# Patient Record
Sex: Male | Born: 1970 | Hispanic: Yes | Smoking: Former smoker
Health system: Southern US, Community
[De-identification: ages and names within clinical notes are randomized; demographics above are authoritative.]

## PROBLEM LIST (undated history)

## (undated) DIAGNOSIS — E785 Hyperlipidemia, unspecified: Secondary | ICD-10-CM

## (undated) DIAGNOSIS — E119 Type 2 diabetes mellitus without complications: Secondary | ICD-10-CM

## (undated) HISTORY — DX: Hyperlipidemia, unspecified: E78.5

## (undated) HISTORY — DX: Type 2 diabetes mellitus without complications: E11.9

---

## 2000-02-23 ENCOUNTER — Encounter: Payer: Self-pay | Admitting: Occupational Medicine

## 2000-02-23 ENCOUNTER — Encounter: Admission: RE | Admit: 2000-02-23 | Discharge: 2000-02-23 | Payer: Self-pay | Admitting: Occupational Medicine

## 2007-05-17 DIAGNOSIS — I219 Acute myocardial infarction, unspecified: Secondary | ICD-10-CM

## 2007-05-17 HISTORY — DX: Acute myocardial infarction, unspecified: I21.9

## 2008-04-15 HISTORY — PX: CORONARY STENT PLACEMENT: SHX1402

## 2008-04-16 ENCOUNTER — Ambulatory Visit: Payer: Self-pay | Admitting: Critical Care Medicine

## 2008-04-16 ENCOUNTER — Ambulatory Visit: Payer: Self-pay | Admitting: Diagnostic Radiology

## 2008-04-16 ENCOUNTER — Inpatient Hospital Stay (HOSPITAL_COMMUNITY): Admission: AD | Admit: 2008-04-16 | Discharge: 2008-04-21 | Payer: Self-pay | Admitting: Interventional Cardiology

## 2008-04-17 ENCOUNTER — Encounter (INDEPENDENT_AMBULATORY_CARE_PROVIDER_SITE_OTHER): Payer: Self-pay | Admitting: Interventional Cardiology

## 2010-09-28 NOTE — Cardiovascular Report (Signed)
NAMEMARKEITH, Tristan Cunningham              ACCOUNT NO.:  [id number]   MEDICAL RECORD NO.:  [id number]          PATIENT TYPE:  INP   LOCATION:  2904                         FACILITY:  [facility]   PHYSICIAN:  Corky Crafts, MDDATE OF BIRTH:  Feb 11, 1971   DATE OF PROCEDURE:  04/16/2008  DATE OF DISCHARGE:                            CARDIAC CATHETERIZATION   REFERRING PHYSICIAN:  Jake Bathe, MD   PROCEDURES PERFORMED:  Left heart catheterization, coronary angiogram,  PCI to LAD.   OPERATOR:  Corky Crafts, MD   INDICATIONS:  Acute anterior ST elevation MI, VFib arrest.   NARRATIVE OF PROCEDURES:  The patient initially presented to [facility] at about 2 p.m. with chest pain.  His initial ECG was  unremarkable.  At  1407, he had a VF arrest and was shocked for the  first time.  He subsequently had 4 more episodes of ventricular  tachycardia or ventricular fibrillation requiring defibrillation.  He  was stabilized on amiodarone and transferred to the [facility] cath lab  emergently for catheterization.  The patient was intubated.  This was an  emergency procedure and consent could not be obtained because the  patient was sedated.  A 6-French sheath was placed into the right  femoral artery using the modified Seldinger technique.  Right coronary  artery angiography was performed using a JR-4 catheter.  Digital  angiography was performed in multiple projections using hand injection  of contrast.  A CLS 3.5 guiding catheter was then placed at the ostium  of the left main.  The PCI was subsequently performed.  Please see below  for details.  After the PCI, a pigtail catheter was advanced to the  ascending aorta and across the aortic valve under fluoroscopic guidance.  A pullback was performed and continuous hemodynamic pressure monitoring.  The sheath was sutured in place.   FINDINGS:  The RCA was a large dominant vessel.  There is a large PDA,  which is angiographically  normal.  There is a medium-sized  posterolateral artery.  There are mild irregularities in the mid RCA.  The left main coronary artery was widely patent.  The left circumflex  was a large vessel with mild irregularities in the mid circumflex.  The  first obtuse marginal had an ostial 30% stenosis.  The second obtuse  marginal was a large vessel and appeared angiographically normal.  The  OM3 and OM4 were small vessels but widely patent.   The left anterior descending was occluded proximally.   PCI narrative:  A CLS 3.5 guiding catheter was used for the diagnostic  as well as for the intervention.  A Prowater wire was placed across the  lesion in the LAD.  A fetch catheter was advanced to the lesion and  aspiration thrombectomy was performed.  Subsequently, there was  improvement in flow at 2.5 x 15, apex was placed across the lesion and  inflated to 10 atmospheres for 17 seconds and then to 10 atmospheres for  20 seconds.  Wire position was inadvertently lost and a new Prowater  wire was placed down the vessel and  decreased flow was noted.  The apex  balloon was placed again in the vessel and inflated to 12 atmospheres  for 19 seconds and then again to 12 atmospheres for 14 seconds.  There  was a visible dissection in the LAD, but normal flow after this balloon  inflation.  A 3.0 x 24 mm Endeavor stent was then placed across the  entire segment of disease, which was quite long.  This stent was  deployed at 12 atmospheres for 39 seconds.  The proximal portion of the  stent was postdilated with a 3.5 x 15 Voyager  balloon inflated to 16  atmospheres for 28 seconds and then to 16 atmospheres for 26 seconds.  There is an excellent angiographic result with no residual stenosis.   Hemodynamics:  LV pressure of 81/27 with an LVEDP of 38 mmHg.  Aortic  pressure of 91/70 with a mean aortic pressure 79 mmHg.   IMPRESSION:  1. Occluded proximal LAD causing ventricular fibrillation and V-tach       arrest.  2. Significantly elevated LVEDP preventing left ventriculogram from      being performed   RECOMMENDATIONS:  The patient will be watched in the ICU and on the  ventilator overnight.  We will give Lasix given his elevated EDP.  I  would recommend continuing aspirin and Plavix for at least 1 year.  He  will also need aggressive secondary prevention when stabilized.  We did  consider placing an intra-aortic balloon pump, but the patient's blood  pressure was stable at approximately 100 systolic at the end of the  procedure.  His oxygen saturations were 100% on the ventilator.  He had  a mild metabolic acidosis.  Likely with diuresis, we will be able to  manage his respiratory status.  In addition, I did not want to give  additional contrast for an aortogram, given his already elevated EDP.      Corky Crafts, MD  Electronically Signed     JSV/MEDQ  D:  04/16/2008  T:  04/17/2008  Job:  [id number]

## 2010-09-28 NOTE — H&P (Signed)
NAMEHENRY, Tristan Cunningham              ACCOUNT NO.:  [id number]   MEDICAL RECORD NO.:  [id number]          PATIENT TYPE:  INP   LOCATION:  2904                         FACILITY:  [facility]   PHYSICIAN:  Jake Bathe, MD      DATE OF BIRTH:  November 28, 1970   DATE OF ADMISSION:  04/16/2008  DATE OF DISCHARGE:                              HISTORY & PHYSICAL   CHIEF COMPLAINT:  Code STEMI, V tach/v fib arrest, anterior ST-elevation  myocardial infarction.   HISTORY OF PRESENT ILLNESS:  A 40 year old male with no prior cardiac  history who earlier this morning took a 1/2 tablet of Viagra at  approximately 9 a.m.  This was the third time total that he took it.  This afternoon at around 2 o'clock, he developed some left-sided chest  pain and arm pain.  Then, EMS was called.  On arrival, EMS at 4:00 p.m.  noted a blood pressure of 140/110 initially with no significant EKG  changes other than some subtle T-wave inversions in the inferior leads  as well as in retrospect hyperacute T-waves in V2 and V3.  While in  route to [facility] Emergency Department, he arrested.  The first shock  was administered at 16:07.  While in [facility], he was given a bolus of  IV amiodarone, and CPR was intermittently performed.  He remained alert  in between ventricular tachycardiac episodes.  He was shocked a total of  5 times.  Code STEMI was called and Dr. Eldridge Dace was made aware of the  patient.  Once the patient was stabilized and intubated, he was sent  over to [facility] for further treatment.   Cardiac catheterization revealed a proximal LAD occlusion 100%, which  was successfully crossed, and initially [place] catheter  utilized/thrombectomy, then balloon, then drug-eluting stent placement  3.0 x 24 mm stent in the proximal LAD portion.  His blood pressure  following the procedure was in the 80s to 90s systolic and his left  ventricular end-diastolic pressure was 39.  No LV gram was performed  secondary  to high LV pressures.   Since the elevated LVEDP and low blood pressure was noticed, 40 mg of IV  Lasix was administered for preload reduction.   PAST MEDICAL HISTORY:  Erectile dysfunction, presumably otherwise none.   ALLERGIES:  No known drug allergies.   MEDICATIONS:  None at home.  Here, he is received 325 of aspirin,  heparin 5000 units IV, amiodarone IV drip, Versed and fentanyl as well  as Zofran.  He was administered 200 mL of IV fluids.   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  Verified  by brother, [place].   FAMILY HISTORY:  Currently noncontributory, but no family history of  coronary artery disease per EMS history.   REVIEW OF SYSTEMS:  Unable secondary to intubation.   PHYSICAL EXAMINATION:  VITAL SIGNS:  Blood pressure on arrival to cath  lab 107/80 with a heart rate of 116, sinus tachycardia.  Post cath blood  pressure was in the 80s to 90s systolic pressure.  Respiration rate set  at 14.  GENERAL:  Sedate, on ventilator, occasionally agitated.  HEENT:  Eyes, pinpoint pupils bilaterally.  NECK:  Supple.  No carotid bruits appreciated.  CARDIOVASCULAR:  Tachycardic, regular rhythm.  No appreciable S3.  Normal appearing PMI.  LUNGS:  Mildly coarse bilaterally prior to cardiac catheterization.  ABDOMEN:  Soft and nontender.  Normoactive bowel sounds.  Mildly  distended.  No bruits appreciated.  EXTREMITIES:  No clubbing, cyanosis, or edema.  Normal distal pulses.  Femoral pulses 2+.  NEUROLOGIC:  Sedate.  SKIN:  Warm, dry, and intact.  No rashes noted.   LABORATORY DATA:  Labs are currently pending.  ECGs as described in HPI.  His EKG at 16:31 showed ST elevations in V2-V5 with concomitant ST  depressions in II, III, aVF, and sinus rhythm.  He does have a strip of  ventricular fibrillation.  Chest x-ray currently pending.  Catheterization as described above.  Successful stent placement of drug-  eluting stent to proximal LAD by Dr.  Eldridge Dace.   ASSESSMENT AND PLAN:  A 39 year old male with ST-elevation anterior  myocardial infarction status post ventricular tachycardia/ventricular  fibrillatory arrest x5 status post cardioversion.  1. Anterior ST-elevation myocardial infarction - successful drug-      eluting stent placement to proximal left anterior descending.  We      will continue Integrilin for 18 hours.  Continue aspirin and      Plavix.  Minimum Plavix time, bare minimum is 6 months with      Endeavor stent.  At this point, I will withhold beta-blocker and      angiotensin-converting enzyme inhibitor secondary to his relative      hypotension.  If needed following IV Lasix, there is always the      option of intraaortic balloon pump.  At this point, he is stable      enough to withhold this at this time.  Continue other aggressive      risk factor modification.  I will give Lipitor 80 mg x1.  We will      check fasting lipid profile.  We will check a set of cardiac      biomarkers in the morning.  Full battery of labs.  2. Coronary artery disease - as described above.  3. Heart failure - Killip class III ST-elevation myocardial      infarction.  Lasix 40 mg IV x1 for diuresis.  Left ventricular end-      diastolic pressure was elevated at 39.  May need further diuresis.      He is intubated, pO2 was 100 with 100% FiO2.  Large alveolar-      arterial gradient.  Likely secondary to degree of heart failure.      Continue with diuresis.  4. Ventricular tachycardia arrest/ventricular fibrillation arrest - on      IV amiodarone.  We will continue for 18 hours.  He has not had any      episodes while here.  5. Respiratory failure - ventilator management by CCM.      Jake Bathe, MD  Electronically Signed     MCS/MEDQ  D:  04/16/2008  T:  04/17/2008  Job:  [id number]

## 2010-10-01 NOTE — Discharge Summary (Signed)
[name], [name]              ACCOUNT NO.:  [id number]   MEDICAL RECORD NO.:  [id number]          PATIENT TYPE:  INP   LOCATION:  2012                         FACILITY:  [facility]   PHYSICIAN:  Jake Bathe, MD      DATE OF BIRTH:  1970-07-19   DATE OF ADMISSION:  04/16/2008  DATE OF DISCHARGE:  04/21/2008                               DISCHARGE SUMMARY   DISCHARGE DIAGNOSES:  1. Acute anterior myocardial infarction status post cardiac arrest.  2. Ventilator-dependent respiratory failure, transient.  3. Left ventricular systolic dysfunction, ejection fraction 40-45%.  4. Tobacco abuse, smoking cessation counseling.  5. Hyperlipidemia, treated.   HOSPITAL COURSE:  Mr. Tristan Cunningham is a 40 year old male patient with no  prior cardiac history, who earlier on the day of admission took half of  a tablet of Viagra at approximately 9:00 a.m.  This was the third time  that he has taken it.  At around 2 o'clock the same day, he developed  some left-sided chest pain and arm pain.  EMS was called on arrival, EMS  at 4 p.m. noted a blood pressure of 140/110 initially with no EKG  changes other than some subtle T-wave inversions in the inferior leads.  While en route to [facility] Emergency Department, he arrested.  The  first shock was administered at 1607.  While at [facility], he was given  a bolus of IV amiodarone and CPR was intermittently performed.  He  remained alert in between ventricular tachycardic episodes.  He was  shocked a total of 5 times.  EKG showed acute anterior segment elevation  and a code STEMI was called.  The patient ultimately required  intubation, and he was sent to [facility] Lab for further  treatment.   Dr. Eldridge Dace took him to the catheterization lab, and he was found to  have an occluded proximal LAD lesion.  An Endeavor stent was utilized  with good results.  It was recommended that the patient remain on  aspirin and Plavix for 1 year.  The patient was  transiently on the  ventilator and this was gradually weaned off without difficulty.   A 2-D echo was performed during this hospitalization and this showed  overall LV function moderately decreased with an EF of 40-45% with  akinesis of the mid distal anterior septal wall, as well as the physical  inferior wall.  A small portion of the anterior septal and basal segment  demonstrated normal contractile performance.  Left atrium was mildly  dilated.   The patient has a history of smoking, and smoking cessation counseling  was provided.   After several days in the hospital and activity as per cardiac  rehabilitation, we felt that he was ready for discharge to home on  April 21, 2008.   LABORATORY STUDIES:  While in the hospital included a hemoglobin A1c of  5.5.  Drug screen only positive for opiates and benzodiazepine.  Hemoglobin 12.8, hematocrit 36.0, platelets 212.  Sodium 134, potassium  4.0, BUN 9, creatinine 0.80.  Total cholesterol 124, triglycerides 39,  HDL 19, LDL 97.  Chest x-ray nothing acute.   The patient is being discharged to home in stable and improved  condition.  He is to remain on a low-sodium heart-healthy diet.  Increase activity slowly.  No lifting over 10 pounds for 1 week.  No  driving for 2 days.  No sexual activity for 2 weeks.  Activity as per  cardiac rehabilitation.  Clean cath site gently with soap and water.  No  scrubbing.  Follow up with Dr. Donato Schultz on May 01, 2008, at 1:30  p.m.   DISCHARGE MEDICATIONS:  1. Plavix 75 mg a day.  2. Enteric-coated aspirin 325 mg a day.  3. Toprol-XL 100 mg a day.  4. Simvastatin 40 mg a day.  5. Lisinopril 2.5 mg 1 tablet daily.  6. Sublingual nitroglycerin p.r.n. chest pain.      Guy Franco, P.A.      Jake Bathe, MD  Electronically Signed    LB/MEDQ  D:  07/18/2008  T:  07/19/2008  Job:  [id number]

## 2011-02-18 LAB — CBC
HCT: 49.3 % (ref 39.0–52.0)
Hemoglobin: 16.6 g/dL (ref 13.0–17.0)
MCHC: 33.8 g/dL (ref 30.0–36.0)
MCHC: 33.9 g/dL (ref 30.0–36.0)
MCHC: 35 g/dL (ref 30.0–36.0)
MCHC: 35.1 g/dL (ref 30.0–36.0)
MCV: 98 fL (ref 78.0–100.0)
MCV: 99.3 fL (ref 78.0–100.0)
MCV: 99.9 fL (ref 78.0–100.0)
Platelets: 199 10*3/uL (ref 150–400)
Platelets: 212 10*3/uL (ref 150–400)
Platelets: 235 10*3/uL (ref 150–400)
Platelets: 248 10*3/uL (ref 150–400)
RBC: 3.68 MIL/uL — ABNORMAL LOW (ref 4.22–5.81)
RBC: 4 MIL/uL — ABNORMAL LOW (ref 4.22–5.81)
RBC: 4.04 MIL/uL — ABNORMAL LOW (ref 4.22–5.81)
RBC: 5.03 MIL/uL (ref 4.22–5.81)
RDW: 12.3 % (ref 11.5–15.5)
RDW: 13.2 % (ref 11.5–15.5)
RDW: 13.3 % (ref 11.5–15.5)
WBC: 14 10*3/uL — ABNORMAL HIGH (ref 4.0–10.5)
WBC: 24.4 10*3/uL — ABNORMAL HIGH (ref 4.0–10.5)
WBC: 28.6 10*3/uL — ABNORMAL HIGH (ref 4.0–10.5)

## 2011-02-18 LAB — COMPREHENSIVE METABOLIC PANEL
ALT: 43 U/L (ref 0–53)
ALT: 89 U/L — ABNORMAL HIGH (ref 0–53)
AST: 102 U/L — ABNORMAL HIGH (ref 0–37)
AST: 37 U/L (ref 0–37)
Albumin: 4.7 g/dL (ref 3.5–5.2)
Alkaline Phosphatase: 81 U/L (ref 39–117)
BUN: 13 mg/dL (ref 6–23)
CO2: 24 mEq/L (ref 19–32)
Calcium: 7 mg/dL — ABNORMAL LOW (ref 8.4–10.5)
Calcium: 9.2 mg/dL (ref 8.4–10.5)
Chloride: 104 mEq/L (ref 96–112)
Creatinine, Ser: 0.88 mg/dL (ref 0.4–1.5)
Creatinine, Ser: 1 mg/dL (ref 0.4–1.5)
GFR calc Af Amer: >60 mL/min (ref >60)
GFR calc Af Amer: >60 mL/min (ref >60)
GFR calc non Af Amer: >60 mL/min (ref >60)
Glucose, Bld: 179 mg/dL — ABNORMAL HIGH (ref 70–99)
Potassium: 3.3 mEq/L — ABNORMAL LOW (ref 3.5–5.1)
Sodium: 133 mEq/L — ABNORMAL LOW (ref 135–145)
Sodium: 143 mEq/L (ref 135–145)
Total Bilirubin: 0.4 mg/dL (ref 0.3–1.2)
Total Protein: 4.9 g/dL — ABNORMAL LOW (ref 6.0–8.3)
Total Protein: 7.4 g/dL (ref 6.0–8.3)

## 2011-02-18 LAB — DIFFERENTIAL
Basophils Absolute: 0 10*3/uL (ref 0.0–0.1)
Basophils Relative: 0 % (ref 0–1)
Eosinophils Absolute: 0 10*3/uL (ref 0.0–0.7)
Eosinophils Absolute: 0.2 10*3/uL (ref 0.0–0.7)
Eosinophils Relative: 0 % (ref 0–5)
Eosinophils Relative: 1 % (ref 0–5)
Lymphocytes Relative: 20 % (ref 12–46)
Lymphocytes Relative: 3 % — ABNORMAL LOW (ref 12–46)
Lymphs Abs: 0.9 10*3/uL (ref 0.7–4.0)
Lymphs Abs: 4.9 10*3/uL — ABNORMAL HIGH (ref 0.7–4.0)
Monocytes Absolute: 1.5 K/uL — ABNORMAL HIGH (ref 0.1–1.0)
Monocytes Relative: 4 % (ref 3–12)
Monocytes Relative: 6 % (ref 3–12)
Neutro Abs: 17.8 10*3/uL — ABNORMAL HIGH (ref 1.7–7.7)
Neutrophils Relative %: 73 % (ref 43–77)
Neutrophils Relative %: 93 % — ABNORMAL HIGH (ref 43–77)

## 2011-02-18 LAB — BLOOD GAS, ARTERIAL
Acid-Base Excess: 0.3 mmol/L (ref 0.0–2.0)
Drawn by: 308591
FIO2: 50 %
MECHVT: 600 mL
PEEP: 5 cmH2O
RATE: 16 resp/min
pCO2 arterial: 34.4 mmHg — ABNORMAL LOW (ref 35.0–45.0)
pH, Arterial: 7.454 — ABNORMAL HIGH (ref 7.350–7.450)
pO2, Arterial: 122 mmHg — ABNORMAL HIGH (ref 80.0–100.0)

## 2011-02-18 LAB — APTT: aPTT: 25 s (ref 24–37)

## 2011-02-18 LAB — PROTIME-INR
INR: 1 (ref 0.00–1.49)
INR: 1.3 (ref 0.00–1.49)
Prothrombin Time: 13.4 s (ref 11.6–15.2)

## 2011-02-18 LAB — HEMOGLOBIN A1C
Hgb A1c MFr Bld: 5.4 % (ref 4.6–6.1)
Hgb A1c MFr Bld: 5.5 % (ref 4.6–6.1)
Mean Plasma Glucose: 108 mg/dL
Mean Plasma Glucose: 111 mg/dL

## 2011-02-18 LAB — RAPID URINE DRUG SCREEN, HOSP PERFORMED
Benzodiazepines: POSITIVE — AB
Cocaine: NOT DETECTED
Opiates: POSITIVE — AB

## 2011-02-18 LAB — BASIC METABOLIC PANEL
BUN: 10 mg/dL (ref 6–23)
BUN: 9 mg/dL (ref 6–23)
CO2: 23 mEq/L (ref 19–32)
CO2: 27 mEq/L (ref 19–32)
Calcium: 8.6 mg/dL (ref 8.4–10.5)
Calcium: 8.6 mg/dL (ref 8.4–10.5)
Chloride: 105 mEq/L (ref 96–112)
Creatinine, Ser: 0.8 mg/dL (ref 0.4–1.5)
Creatinine, Ser: 0.93 mg/dL (ref 0.4–1.5)
Creatinine, Ser: 0.99 mg/dL (ref 0.4–1.5)
GFR calc Af Amer: >60 mL/min (ref >60)
GFR calc Af Amer: >60 mL/min (ref >60)
GFR calc Af Amer: >60 mL/min (ref >60)
GFR calc non Af Amer: >60 mL/min (ref >60)
GFR calc non Af Amer: >60 mL/min (ref >60)
Glucose, Bld: 121 mg/dL — ABNORMAL HIGH (ref 70–99)
Sodium: 134 mEq/L — ABNORMAL LOW (ref 135–145)

## 2011-02-18 LAB — POCT I-STAT 3, ART BLOOD GAS (G3+)
Acid-base deficit: 8 mmol/L — ABNORMAL HIGH (ref 0.0–2.0)
O2 Saturation: 97 %
pCO2 arterial: 39.2 mmHg (ref 35.0–45.0)
pO2, Arterial: 108 mmHg — ABNORMAL HIGH (ref 80.0–100.0)

## 2011-02-18 LAB — PHOSPHORUS: Phosphorus: 2.8 mg/dL (ref 2.3–4.6)

## 2011-02-18 LAB — POCT CARDIAC MARKERS
CKMB, poc: <1 ng/mL — ABNORMAL LOW (ref 1.0–8.0)
Myoglobin, poc: 110 ng/mL (ref 12–200)
Troponin i, poc: <0.05 ng/mL (ref 0.00–0.09)

## 2011-02-18 LAB — LIPID PANEL
Cholesterol: 124 mg/dL (ref 0–200)
HDL: 19 mg/dL — ABNORMAL LOW (ref >39)
Total CHOL/HDL Ratio: 6.5 RATIO
Triglycerides: 39 mg/dL (ref <150)

## 2011-02-18 LAB — CARDIAC PANEL(CRET KIN+CKTOT+MB+TROPI)
CK, MB: 33.7 ng/mL — ABNORMAL HIGH (ref 0.3–4.0)
Total CK: 432 U/L — ABNORMAL HIGH (ref 7–232)
Troponin I: 0.94 ng/mL (ref 0.00–0.06)

## 2011-02-18 LAB — MAGNESIUM: Magnesium: 2 mg/dL (ref 1.5–2.5)

## 2011-02-18 LAB — CK TOTAL AND CKMB (NOT AT ARMC)
CK, MB: 285.9 ng/mL — ABNORMAL HIGH (ref 0.3–4.0)
Total CK: 4576 U/L — ABNORMAL HIGH (ref 7–232)

## 2011-02-18 LAB — TRIGLYCERIDES: Triglycerides: 109 mg/dL (ref <150)

## 2020-05-20 DIAGNOSIS — Z20828 Contact with and (suspected) exposure to other viral communicable diseases: Secondary | ICD-10-CM | POA: Diagnosis not present

## 2020-05-29 ENCOUNTER — Ambulatory Visit: Payer: BC Managed Care – PPO | Admitting: Medical

## 2020-06-08 ENCOUNTER — Other Ambulatory Visit: Payer: Self-pay

## 2020-06-09 ENCOUNTER — Ambulatory Visit (INDEPENDENT_AMBULATORY_CARE_PROVIDER_SITE_OTHER): Payer: BC Managed Care – PPO | Admitting: Medical

## 2020-06-09 ENCOUNTER — Encounter: Payer: Self-pay | Admitting: Medical

## 2020-06-09 ENCOUNTER — Other Ambulatory Visit: Payer: Self-pay

## 2020-06-09 VITALS — BP 135/85 | HR 91 | Resp 22 | Ht 70.0 in | Wt 192.0 lb

## 2020-06-09 DIAGNOSIS — Z113 Encounter for screening for infections with a predominantly sexual mode of transmission: Secondary | ICD-10-CM

## 2020-06-09 DIAGNOSIS — Z1211 Encounter for screening for malignant neoplasm of colon: Secondary | ICD-10-CM | POA: Diagnosis not present

## 2020-06-09 DIAGNOSIS — Z23 Encounter for immunization: Secondary | ICD-10-CM

## 2020-06-09 DIAGNOSIS — I252 Old myocardial infarction: Secondary | ICD-10-CM

## 2020-06-09 DIAGNOSIS — Z125 Encounter for screening for malignant neoplasm of prostate: Secondary | ICD-10-CM

## 2020-06-09 DIAGNOSIS — Z0000 Encounter for general adult medical examination without abnormal findings: Secondary | ICD-10-CM

## 2020-06-09 DIAGNOSIS — E785 Hyperlipidemia, unspecified: Secondary | ICD-10-CM

## 2020-06-09 NOTE — Patient Instructions (Addendum)
For you wellness exam today I have ordered cbc, cmp, psa, lipid panel and hiv screen.  Vaccine given today flu vaccine.  Recommend exercise and healthy diet.  We will let you know lab results as they come in.  Follow up date appointment 2 weeks or as needed.  Referral to gi for colonoscopy.  Referral to cardiologist due to hx of mi when 50 yo.  Check bp daily and let me know bp levels in one week.   For gerd can continue omeprazole and healthy diet.   Preventive Care 60-46 Years Old, Male Preventive care refers to lifestyle choices and visits with your health care provider that can promote health and wellness. This includes:  A yearly physical exam. This is also called an annual wellness visit.  Regular dental and eye exams.  Immunizations.  Screening for certain conditions.  Healthy lifestyle choices, such as: ? Eating a healthy diet. ? Getting regular exercise. ? Not using drugs or products that contain nicotine and tobacco. ? Limiting alcohol use. What can I expect for my preventive care visit? Physical exam Your health care provider will check your:  Height and weight. These may be used to calculate your BMI (body mass index). BMI is a measurement that tells if you are at a healthy weight.  Heart rate and blood pressure.  Body temperature.  Skin for abnormal spots. Counseling Your health care provider may ask you questions about your:  Past medical problems.  Family's medical history.  Alcohol, tobacco, and drug use.  Emotional well-being.  Home life and relationship well-being.  Sexual activity.  Diet, exercise, and sleep habits.  Work and work Statistician.  Access to firearms. What immunizations do I need? Vaccines are usually given at various ages, according to a schedule. Your health care provider will recommend vaccines for you based on your age, medical history, and lifestyle or other factors, such as travel or where you work.   What tests  do I need? Blood tests  Lipid and cholesterol levels. These may be checked every 5 years, or more often if you are over 57 years old.  Hepatitis C test.  Hepatitis B test. Screening  Lung cancer screening. You may have this screening every year starting at age 65 if you have a 30-pack-year history of smoking and currently smoke or have quit within the past 15 years.  Prostate cancer screening. Recommendations will vary depending on your family history and other risks.  Genital exam to check for testicular cancer or hernias.  Colorectal cancer screening. ? All adults should have this screening starting at age 44 and continuing until age 14. ? Your health care provider may recommend screening at age 70 if you are at increased risk. ? You will have tests every 1-10 years, depending on your results and the type of screening test.  Diabetes screening. ? This is done by checking your blood sugar (glucose) after you have not eaten for a while (fasting). ? You may have this done every 1-3 years.  STD (sexually transmitted disease) testing, if you are at risk. Follow these instructions at home: Eating and drinking  Eat a diet that includes fresh fruits and vegetables, whole grains, lean protein, and low-fat dairy products.  Take vitamin and mineral supplements as recommended by your health care provider.  Do not drink alcohol if your health care provider tells you not to drink.  If you drink alcohol: ? Limit how much you have to 0-2 drinks a day. ? Be  aware of how much alcohol is in your drink. In the [place] one drink equals one 12 oz bottle of beer (355 mL), one 5 oz glass of wine (148 mL), or one 1 oz glass of hard liquor (44 mL).   Lifestyle  Take daily care of your teeth and gums. Brush your teeth every morning and night with fluoride toothpaste. Floss one time each day.  Stay active. Exercise for at least 30 minutes 5 or more days each week.  Do not use any products that  contain nicotine or tobacco, such as cigarettes, e-cigarettes, and chewing tobacco. If you need help quitting, ask your health care provider.  Do not use drugs.  If you are sexually active, practice safe sex. Use a condom or other form of protection to prevent STIs (sexually transmitted infections).  If told by your health care provider, take low-dose aspirin daily starting at age 59.  Find healthy ways to cope with stress, such as: ? Meditation, yoga, or listening to music. ? Journaling. ? Talking to a trusted person. ? Spending time with friends and family. Safety  Always wear your seat belt while driving or riding in a vehicle.  Do not drive: ? If you have been drinking alcohol. Do not ride with someone who has been drinking. ? When you are tired or distracted. ? While texting.  Wear a helmet and other protective equipment during sports activities.  If you have firearms in your house, make sure you follow all gun safety procedures. What's next?  Go to your health care provider once a year for an annual wellness visit.  Ask your health care provider how often you should have your eyes and teeth checked.  Stay up to date on all vaccines. This information is not intended to replace advice given to you by your health care provider. Make sure you discuss any questions you have with your health care provider. Document Revised: 01/29/2019 Document Reviewed: 04/26/2018 Elsevier Patient Education  2021 [employer].

## 2020-06-09 NOTE — Progress Notes (Signed)
Subjective:    Patient ID: Tristan Cunningham, male    DOB: Sep 07, 1970, 50 y.o.   MRN: [id number]  HPI  Pt in for first time.  Pt works Architect of roads, walks a lot, pt states diet is moderate healthy, quite smoking 2 years ago. Pt drinks 1 beer a day. No illegal drug use. Does vape.  Pt states MI either 50 yo or 50 yo. Pt states he was very stressed at that time. No family history of sudden death in parents of siblings. Pt denies any use of cccaine.  Has 90 yo son. Going thru divorce.  Pt states he has no current cardiologist. He states was on meds briefly for 2 years and meds were gradually decreased. Pt has one stent after MI>  Pt does report history of gerd. No current reflux symptoms. He gets random reflux after eating certain foods. Now takes occasional omeprazole.   Pt has gotten covid Actor x2.       Review of Systems  Constitutional: Negative for chills, fatigue and fever.  HENT: Negative for congestion and ear discharge.   Respiratory: Negative for cough, chest tightness, shortness of breath and wheezing.   Cardiovascular: Negative for chest pain and palpitations.  Gastrointestinal: Negative for abdominal pain.  Genitourinary: Negative for dysuria.  Neurological: Negative for dizziness and headaches.  Hematological: Negative for adenopathy. Does not bruise/bleed easily.    No past medical history on file.   Social History   Socioeconomic History  . Marital status: Divorced    Spouse name: Not on file  . Number of children: Not on file  . Years of education: Not on file  . Highest education level: Not on file  Occupational History  . Not on file  Tobacco Use  . Smoking status: Not on file  . Smokeless tobacco: Not on file  Substance and Sexual Activity  . Alcohol use: Not on file  . Drug use: Not on file  . Sexual activity: Not on file  Other Topics Concern  . Not on file  Social History Narrative  . Not on file   Social Determinants  of Health   Financial Resource Strain: Not on file  Food Insecurity: Not on file  Transportation Needs: Not on file  Physical Activity: Not on file  Stress: Not on file  Social Connections: Not on file  Intimate Partner Violence: Not on file     No family history on file.  Not on File  No current outpatient medications on file prior to visit.   No current facility-administered medications on file prior to visit.    BP (!) 145/89   Pulse 91   Resp (!) 22   Ht 5\' 10"  (1.778 m)   Wt 192 lb (87.1 kg)   SpO2 96%   BMI 27.55 kg/m       Objective:   Physical Exam  General Mental Status- Alert. General Appearance- Not in acute distress.   Skin General: Color- Normal Color. Moisture- Normal Moisture.  Neck Carotid Arteries- Normal color. Moisture- Normal Moisture. No carotid bruits. No JVD.  Chest and Lung Exam Auscultation: Breath Sounds:-Normal.  Cardiovascular Auscultation:Rythm- Regular. Murmurs & Other Heart Sounds:Auscultation of the heart reveals- No Murmurs.  Abdomen Inspection:-Inspeection Normal. Palpation/Percussion:Note:No mass. Palpation and Percussion of the abdomen reveal- Non Tender, Non Distended + BS, no rebound or guarding.    Neurologic Cranial Nerve exam:- CN III-XII intact(No nystagmus), symmetric smile. Strength:- 5/5 equal and symmetric strength both upper and lower extremities.  Assessment & Plan:  For you wellness exam today I have ordered cbc, cmp, psa, lipid panel and hiv screen.  Vaccine given today flu vaccine.  Recommend exercise and healthy diet.  We will let you know lab results as they come in.  Follow up date appointment 2 weeks or as needed.  Referral to gi for colonoscopy.  Referral to cardiologist due to hx of mi when 50 yo.  Check bp daily and let me know bp levels in one week.   Mackie Pai, PA-C

## 2020-06-10 LAB — COMPREHENSIVE METABOLIC PANEL
ALT: 33 U/L (ref 0–53)
AST: 19 U/L (ref 0–37)
Albumin: 4.6 g/dL (ref 3.5–5.2)
Alkaline Phosphatase: 72 U/L (ref 39–117)
BUN: 14 mg/dL (ref 6–23)
CO2: 25 mEq/L (ref 19–32)
Calcium: 9.8 mg/dL (ref 8.4–10.5)
Chloride: 100 mEq/L (ref 96–112)
Creatinine, Ser: 0.85 mg/dL (ref 0.40–1.50)
GFR: 100.68 mL/min (ref >60.00)
Glucose, Bld: 180 mg/dL — ABNORMAL HIGH (ref 70–99)
Potassium: 3.9 mEq/L (ref 3.5–5.1)
Sodium: 134 mEq/L — ABNORMAL LOW (ref 135–145)
Total Bilirubin: 0.5 mg/dL (ref 0.2–1.2)
Total Protein: 7.2 g/dL (ref 6.0–8.3)

## 2020-06-10 LAB — CBC WITH DIFFERENTIAL/PLATELET
Basophils Absolute: 0.1 10*3/uL (ref 0.0–0.1)
Basophils Relative: 0.8 % (ref 0.0–3.0)
Eosinophils Absolute: 0.1 10*3/uL (ref 0.0–0.7)
Eosinophils Relative: 0.6 % (ref 0.0–5.0)
HCT: 43.7 % (ref 39.0–52.0)
Hemoglobin: 15.1 g/dL (ref 13.0–17.0)
Lymphocytes Relative: 19 % (ref 12.0–46.0)
Lymphs Abs: 1.7 10*3/uL (ref 0.7–4.0)
MCHC: 34.5 g/dL (ref 30.0–36.0)
MCV: 96.5 fl (ref 78.0–100.0)
Monocytes Absolute: 0.5 10*3/uL (ref 0.1–1.0)
Monocytes Relative: 5.9 % (ref 3.0–12.0)
Neutro Abs: 6.5 10*3/uL (ref 1.4–7.7)
Neutrophils Relative %: 73.7 % (ref 43.0–77.0)
Platelets: 216 10*3/uL (ref 150.0–400.0)
RBC: 4.53 Mil/uL (ref 4.22–5.81)
RDW: 12.7 % (ref 11.5–15.5)
WBC: 8.8 10*3/uL (ref 4.0–10.5)

## 2020-06-10 LAB — LDL CHOLESTEROL, DIRECT: Direct LDL: 159 mg/dL

## 2020-06-10 LAB — LIPID PANEL
Cholesterol: 249 mg/dL — ABNORMAL HIGH (ref 0–200)
HDL: 52.3 mg/dL (ref >39.00)
NonHDL: 196.94
Total CHOL/HDL Ratio: 5
Triglycerides: 382 mg/dL — ABNORMAL HIGH (ref 0.0–149.0)
VLDL: 76.4 mg/dL — ABNORMAL HIGH (ref 0.0–40.0)

## 2020-06-10 LAB — HIV ANTIBODY (ROUTINE TESTING W REFLEX): HIV 1&2 Ab, 4th Generation: NONREACTIVE

## 2020-06-10 LAB — PSA: PSA: 0.68 ng/mL (ref 0.10–4.00)

## 2020-06-11 ENCOUNTER — Telehealth: Payer: Self-pay | Admitting: Medical

## 2020-06-11 MED ORDER — ATORVASTATIN CALCIUM 10 MG PO TABS: 10.0000 mg | ORAL_TABLET | Freq: Every day | ORAL | 3 refills | Status: DC

## 2020-06-11 MED ORDER — METFORMIN HCL 500 MG PO TABS: 500.0000 mg | ORAL_TABLET | Freq: Two times a day (BID) | ORAL | 3 refills | Status: DC

## 2020-06-11 NOTE — Telephone Encounter (Signed)
Rx meds sent to pt pharmacy.

## 2020-06-12 ENCOUNTER — Other Ambulatory Visit (INDEPENDENT_AMBULATORY_CARE_PROVIDER_SITE_OTHER): Payer: BC Managed Care – PPO

## 2020-06-12 DIAGNOSIS — R739 Hyperglycemia, unspecified: Secondary | ICD-10-CM | POA: Diagnosis not present

## 2020-06-12 LAB — HEMOGLOBIN A1C: Hgb A1c MFr Bld: 6.7 % — ABNORMAL HIGH (ref 4.6–6.5)

## 2020-06-12 NOTE — Addendum Note (Signed)
Addended by: Trenda Moots on: 7/34/1937 09:26 AM   Modules accepted: Orders

## 2020-06-16 DIAGNOSIS — E785 Hyperlipidemia, unspecified: Secondary | ICD-10-CM | POA: Insufficient documentation

## 2020-06-16 NOTE — Progress Notes (Signed)
Cardiology Office Note:    Date:  06/17/2020   ID:  Tristan Cunningham, DOB [birth year], MRN [id number]  PCP:  Mackie Pai, PA-C  Cardiologist:  Shirlee More, MD   Referring MD: Mackie Pai, PA-C when you do a lipid profile please also do an LP(a) level seen in young people with premature CAD if present would guide therapy with intensification.  He will likely require high dose of atorvastatin and if his LDL remains greater than 70 I would favor adding Repatha.  ASSESSMENT:    1. Coronary artery disease of native artery of native heart with stable angina pectoris ([place])   2. Pure hypercholesterolemia   3. Prediabetes   4. Chest pain of uncertain etiology    PLAN:    In order of problems listed above:  1. He has premature CAD anterior MI age 21 PCI and stent in a decade of a lapse in medical care.  His risk factors include cigarette smoking stopped 3 years ago prediabetes and untreated severe dyslipidemia quite elevated LDL.  Fortunately he is now on a statin diabetic treatment Metformin will start aspirin 81 mg daily.  Hypertension is not an issue BP at target.  I think in this case with the unusual aspect of his LV dysfunction he would benefit from myocardial perfusion test especially if treadmill to define ejection fraction and ischemia to guide further treatment.  If EF is reduced would benefit from guideline directed therapy beta-blocker and Entresto.  I strongly encourage full compliance with treatment.  He is committed to exercise and weight loss achieve his ideal weight of 170 pounds. 2. Severe dyslipidemia just started a statin when he is repeat labs in mid and LP(a) level to screen him for this genetic disorder and likely require a high dose of a statin along with PCSK9 therapy to achieve low LDLs under 55 he just started treatment and I would wait to see his response to the first dose of atorvastatin. 3. Weight loss exercise and Metformin is appropriate  Next appointment 6 months  if his perfusion study is abnormal I will bring him to the office or if he needs to have guideline directed therapy for his LV dysfunction.   Medication Adjustments/Labs and Tests Ordered: Current medicines are reviewed at length with the patient today.  Concerns regarding medicines are outlined above.  Orders Placed This Encounter  Procedures  . MYOCARDIAL PERFUSION IMAGING  . EKG 12-Lead   Meds ordered this encounter  Medications  . aspirin EC 81 MG tablet    Sig: Take 1 tablet (81 mg total) by mouth daily. Swallow whole.    Dispense:  90 tablet    Refill:  3     Chief Complaint  Patient presents with  . Coronary Artery Disease    History of Present Illness:    Tristan Cunningham is a 50 y.o. male who is being seen today for the evaluation of CAD at the request of Saguier, Percell Miller, [place].  Chart review shows that he is admitted to [facility] 04/16/2008 discharge 04/21/2008 after sustaining cardiac arrest in the setting of acute anterior wall myocardial infarction ejection fraction 40 to 45% transient ventricular ventilator dependent respiratory failure hyperlipidemia and cigarette smoking.  Urgent heart catheterization showed occlusion of the proximal left anterior descending coronary artery with PCI and drug-eluting stent.  Ejection fraction was diminished 40 to 45% with akinesia of the mid and distal anterior septal wall as well as the apical inferior wall.  He has had no  cardiology care in the last 10 years. He stopped smoking a few years ago he vapes. He was found to have severe dyslipidemia LDL cholesterol 159 and was started on high intensity statin He is prediabetic A1c 6.7% and started Metformin.  His initial presentation was more of abdominal epigastric discomfort and VT VF in hospital. 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 He is very interested in his health now and somewhat concerned his friends told him that stents  were out at 10 years. Surprisingly he has no family history of coronary disease  Past Medical History:  Diagnosis Date  . Hyperlipidemia     Past Surgical History:  Procedure Laterality Date  . CORONARY STENT PLACEMENT  04/2008    Current Medications: Current Meds  Medication Sig  . aspirin EC 81 MG tablet Take 1 tablet (81 mg total) by mouth daily. Swallow whole.  Marland Kitchen atorvastatin (LIPITOR) 10 MG tablet Take 1 tablet (10 mg total) by mouth daily.  . metFORMIN (GLUCOPHAGE) 500 MG tablet Take 1 tablet (500 mg total) by mouth 2 (two) times daily with a meal.     Allergies:   Patient has no known allergies.   Social History   Socioeconomic History  . Marital status: Divorced    Spouse name: Not on file  . Number of children: Not on file  . Years of education: Not on file  . Highest education level: Not on file  Occupational History  . Not on file  Tobacco Use  . Smoking status: Former Smoker    Packs/day: 1.00    Years: 35.00    Pack years: 35.00    Start date: 09/08/1983    Quit date: 01/15/2019    Years since quitting: 1.4  . Smokeless tobacco: Never Used  Vaping Use  . Vaping Use: Every day  . Start date: 01/15/2019  Substance and Sexual Activity  . Alcohol use: Yes    Comment: 1 beer a day.  . Drug use: Not Currently  . Sexual activity: Yes  Other Topics Concern  . Not on file  Social History Narrative  . Not on file   Social Determinants of Health   Financial Resource Strain: Not on file  Food Insecurity: Not on file  Transportation Needs: Not on file  Physical Activity: Not on file  Stress: Not on file  Social Connections: Not on file     Family History: The patient's family history includes Colon cancer in his maternal grandfather; Diabetes in his sister; Lung cancer in his father.  ROS:   ROS Please see the history of present illness.     All other systems reviewed and are negative.  EKGs/Labs/Other Studies Reviewed:    The following studies  were reviewed today:   EKG:  EKG is  ordered today.  The ekg ordered today is personally reviewed and demonstrates sinus rhythm EKG  Recent Labs: 06/09/2020: ALT 33; BUN 14; Creatinine, Ser 0.85; Hemoglobin 15.1; Platelets 216.0; Potassium 3.9; Sodium 134  Recent Lipid Panel he has mixed dyslipidemia with elevated cholesterol triglycerides and LDL.      Component Value Date/Time   CHOL 249 (H) 06/09/2020 1405   TRIG 382.0 (H) 06/09/2020 1405   HDL 52.30 06/09/2020 1405   CHOLHDL 5 06/09/2020 1405   VLDL 76.4 (H) 06/09/2020 1405   LDLCALC  04/16/2008 1859    97        Total Cholesterol/HDL:CHD Risk Coronary Heart Disease Risk Table  Men   Women  1/2 Average Risk   3.4   3.3   LDLDIRECT 159.0 06/09/2020 1405    Physical Exam:    VS:  BP 124/70   Pulse (!) 103   Ht 5\' 10"  (1.778 m)   Wt 198 lb (89.8 kg)   SpO2 95%   BMI 28.41 kg/m     Wt Readings from Last 3 Encounters:  06/17/20 198 lb (89.8 kg)  06/09/20 192 lb (87.1 kg)     GEN: He appears his age he has no xanthoma or xanthelasma well nourished, well developed in no acute distress he is overweight BMI 28 HEENT: Normal NECK: No JVD; No carotid bruits LYMPHATICS: No lymphadenopathy CARDIAC: RRR, no murmurs, rubs, gallops RESPIRATORY:  Clear to auscultation without rales, wheezing or rhonchi  ABDOMEN: Soft, non-tender, non-distended MUSCULOSKELETAL:  No edema; No deformity  SKIN: Warm and dry NEUROLOGIC:  Alert and oriented x 3 PSYCHIATRIC:  Normal affect     Signed, Shirlee More, MD  06/17/2020 3:21 PM    [facility]

## 2020-06-17 ENCOUNTER — Other Ambulatory Visit: Payer: Self-pay

## 2020-06-17 ENCOUNTER — Ambulatory Visit: Payer: BC Managed Care – PPO | Admitting: Cardiology

## 2020-06-17 ENCOUNTER — Encounter: Payer: Self-pay | Admitting: Cardiology

## 2020-06-17 VITALS — BP 124/70 | HR 103 | Ht 70.0 in | Wt 198.0 lb

## 2020-06-17 DIAGNOSIS — R7303 Prediabetes: Secondary | ICD-10-CM | POA: Diagnosis not present

## 2020-06-17 DIAGNOSIS — I25118 Atherosclerotic heart disease of native coronary artery with other forms of angina pectoris: Secondary | ICD-10-CM | POA: Diagnosis not present

## 2020-06-17 DIAGNOSIS — R079 Chest pain, unspecified: Secondary | ICD-10-CM

## 2020-06-17 DIAGNOSIS — E7800 Pure hypercholesterolemia, unspecified: Secondary | ICD-10-CM | POA: Diagnosis not present

## 2020-06-17 DIAGNOSIS — I2510 Atherosclerotic heart disease of native coronary artery without angina pectoris: Secondary | ICD-10-CM | POA: Insufficient documentation

## 2020-06-17 MED ORDER — ASPIRIN EC 81 MG PO TBEC: 81.0000 mg | DELAYED_RELEASE_TABLET | Freq: Every day | ORAL | 3 refills | Status: AC

## 2020-06-17 NOTE — Patient Instructions (Signed)
Medication Instructions:  Your physician has recommended you make the following change in your medication:  START: Aspirin 81 mg take one tablet by mouth daily.  *If you need a refill on your cardiac medications before your next appointment, please call your pharmacy*   Lab Work: None If you have labs (blood work) drawn today and your tests are completely normal, you will receive your results only by: Marland Kitchen MyChart Message (if you have MyChart) OR . A paper copy in the mail If you have any lab test that is abnormal or we need to change your treatment, we will call you to review the results.   Testing/Procedures:   [facility]ovascular [facility] at [facility] [address] [place], [place] [address] Phone: [phone]    Please arrive 15 minutes prior to your appointment time for registration and insurance purposes.  The test will take approximately 3 to 4 hours to complete; you may bring reading material.  If someone comes with you to your appointment, they will need to remain in the main lobby due to limited space in the testing area. **If you are pregnant or breastfeeding, please notify the nuclear lab prior to your appointment**  How to prepare for your Myocardial Perfusion Test: . Do not eat or drink 3 hours prior to your test, except you may have water. . Do not consume products containing caffeine (regular or decaffeinated) 12 hours prior to your test. (ex: coffee, chocolate, sodas, tea). . Do bring a list of your current medications with you.  If not listed below, you may take your medications as normal. . HOLD diabetic medication/insulin the morning of the test: Metformin . Do wear comfortable clothes (no dresses or overalls) and walking shoes, tennis shoes preferred (No heels or open toe shoes are allowed). . Do NOT wear cologne, perfume, aftershave, or lotions (deodorant is allowed). . If these instructions are not followed, your test will have to be  rescheduled.  Please report to [address] for your test.  If you have questions or concerns about your appointment, you can call the Nuclear Lab at [phone].  If you cannot keep your appointment, please provide 24 hours notification to the Nuclear Lab, to avoid a possible $50 charge to your account.    Follow-Up: At [facility], you and your health needs are our priority.  As part of our continuing mission to provide you with exceptional heart care, we have created designated Provider Care Teams.  These Care Teams include your primary Cardiologist (physician) and Advanced Practice Providers (APPs -  Physician Assistants and Nurse Practitioners) who all work together to provide you with the care you need, when you need it.  We recommend signing up for the patient portal called "MyChart".  Sign up information is provided on this After Visit Summary.  MyChart is used to connect with patients for Virtual Visits (Telemedicine).  Patients are able to view lab/test results, encounter notes, upcoming appointments, etc.  Non-urgent messages can be sent to your provider as well.   To learn more about what you can do with MyChart, go to [email or web address].    Your next appointment:   6 month(s)  The format for your next appointment:   In Person  Provider:   Shirlee More, MD   Other Instructions

## 2020-06-18 ENCOUNTER — Telehealth: Payer: Self-pay

## 2020-06-18 NOTE — Telephone Encounter (Signed)
Order for stress test placed.

## 2020-06-18 NOTE — Addendum Note (Signed)
Addended byShirlee More on: 06/18/2020 12:02 PM   Modules accepted: Orders

## 2020-06-22 ENCOUNTER — Encounter: Payer: Self-pay | Admitting: Gastroenterology

## 2020-06-24 ENCOUNTER — Telehealth (HOSPITAL_COMMUNITY): Payer: Self-pay | Admitting: *Deleted

## 2020-06-24 ENCOUNTER — Encounter: Payer: Self-pay | Admitting: Medical

## 2020-06-24 ENCOUNTER — Ambulatory Visit: Payer: BC Managed Care – PPO | Admitting: Medical

## 2020-06-24 ENCOUNTER — Other Ambulatory Visit: Payer: Self-pay

## 2020-06-24 VITALS — BP 110/70 | HR 92 | Temp 98.9°F | Resp 18 | Ht 70.0 in | Wt 199.0 lb

## 2020-06-24 DIAGNOSIS — E119 Type 2 diabetes mellitus without complications: Secondary | ICD-10-CM | POA: Diagnosis not present

## 2020-06-24 DIAGNOSIS — I25118 Atherosclerotic heart disease of native coronary artery with other forms of angina pectoris: Secondary | ICD-10-CM

## 2020-06-24 DIAGNOSIS — B49 Unspecified mycosis: Secondary | ICD-10-CM | POA: Diagnosis not present

## 2020-06-24 DIAGNOSIS — E785 Hyperlipidemia, unspecified: Secondary | ICD-10-CM

## 2020-06-24 MED ORDER — NYSTATIN 100000 UNIT/GM EX CREA: 1.0000 "application " | TOPICAL_CREAM | Freq: Two times a day (BID) | CUTANEOUS | 1 refills | Status: AC

## 2020-06-24 NOTE — Patient Instructions (Addendum)
For diabetes continue metformin and eat low sugar diet. Some weight loss will be beneficial.  For high cholesterol low cholesterol diet and continue atorvastatin.  For CAD history follow thru with work up recommend by Dr. Bettina Gavia.  Get colonoscopy as scheduled.  For perineum rash worse with sweating rx'd nystatin.  Follow up 3 months or as needed

## 2020-06-24 NOTE — Progress Notes (Signed)
Subjective:    Patient ID: Tristan Cunningham, male    DOB: 1970/06/28, 50 y.o.   MRN: [id number]  HPI Pt in for follow up.    Sent pt to cardiologist and below is plan.  PLAN:    In order of problems listed above:  1. He has premature CAD anterior MI age 64 PCI and stent in a decade of a lapse in medical care.  His risk factors include cigarette smoking stopped 3 years ago prediabetes and untreated severe dyslipidemia quite elevated LDL.  Fortunately he is now on a statin diabetic treatment Metformin will start aspirin 81 mg daily.  Hypertension is not an issue BP at target.  I think in this case with the unusual aspect of his LV dysfunction he would benefit from myocardial perfusion test especially if treadmill to define ejection fraction and ischemia to guide further treatment.  If EF is reduced would benefit from guideline directed therapy beta-blocker and Entresto.  I strongly encourage full compliance with treatment.  He is committed to exercise and weight loss achieve his ideal weight of 170 pounds. 2. Severe dyslipidemia just started a statin when he is repeat labs in mid and LP(a) level to screen him for this genetic disorder and likely require a high dose of a statin along with PCSK9 therapy to achieve low LDLs under 55 he just started treatment and I would wait to see his response to the first dose of atorvastatin. 3. Weight loss exercise and Metformin is appropriate  Pt also has appointment for colonoscopy for March.   Pt has high cholesterol and is on atovastatin. Diabetes as well. He is on metformin.  Walking about one hour a day.  Pt also mentions that he will get occasional red rash to perineum area. Area itches a lot. Had in past and he got steroid cream that may have helped.    Review of Systems  Constitutional: Negative for chills, diaphoresis and fever.  HENT: Negative for congestion, drooling, ear pain and facial swelling.   Respiratory: Negative for cough, chest  tightness and wheezing.   Cardiovascular: Negative for chest pain and palpitations.  Gastrointestinal: Negative for abdominal pain, blood in stool, constipation, nausea and vomiting.  Genitourinary: Negative for dysuria, flank pain and frequency.  Musculoskeletal: Negative for back pain and myalgias.  Skin: Negative for rash.  Neurological: Negative for dizziness, seizures, weakness and headaches.  Hematological: Negative for adenopathy. Does not bruise/bleed easily.  Psychiatric/Behavioral: Negative for behavioral problems, confusion, sleep disturbance and suicidal ideas. The patient is not nervous/anxious.     Past Medical History:  Diagnosis Date  . Hyperlipidemia      Social History   Socioeconomic History  . Marital status: Divorced    Spouse name: Not on file  . Number of children: Not on file  . Years of education: Not on file  . Highest education level: Not on file  Occupational History  . Not on file  Tobacco Use  . Smoking status: Former Smoker    Packs/day: 1.00    Years: 35.00    Pack years: 35.00    Start date: 09/08/1983    Quit date: 01/15/2019    Years since quitting: 1.4  . Smokeless tobacco: Never Used  Vaping Use  . Vaping Use: Every day  . Start date: 01/15/2019  Substance and Sexual Activity  . Alcohol use: Yes    Comment: 1 beer a day.  . Drug use: Not Currently  . Sexual activity: Yes  Other  Topics Concern  . Not on file  Social History Narrative  . Not on file   Social Determinants of Health   Financial Resource Strain: Not on file  Food Insecurity: Not on file  Transportation Needs: Not on file  Physical Activity: Not on file  Stress: Not on file  Social Connections: Not on file  Intimate Partner Violence: Not on file    Past Surgical History:  Procedure Laterality Date  . CORONARY STENT PLACEMENT  04/2008    Family History  Problem Relation Age of Onset  . Lung cancer Father   . Diabetes Sister   . Colon cancer Maternal  Grandfather     No Known Allergies  Current Outpatient Medications on File Prior to Visit  Medication Sig Dispense Refill  . aspirin EC 81 MG tablet Take 1 tablet (81 mg total) by mouth daily. Swallow whole. 90 tablet 3  . atorvastatin (LIPITOR) 10 MG tablet Take 1 tablet (10 mg total) by mouth daily. 30 tablet 3  . metFORMIN (GLUCOPHAGE) 500 MG tablet Take 1 tablet (500 mg total) by mouth 2 (two) times daily with a meal. 60 tablet 3   No current facility-administered medications on file prior to visit.    BP 110/70 (BP Location: Left Arm, Patient Position: Sitting, Cuff Size: Normal)   Pulse 92   Temp 98.9 F (37.2 C) (Oral)   Resp 18   Ht 5\' 10"  (1.778 m)   Wt 199 lb (90.3 kg)   SpO2 95%   BMI 28.55 kg/m       Objective:   Physical Exam  General Mental Status- Alert. General Appearance- Not in acute distress.   Skin Slight faint pink to perineum area.  Neck Carotid Arteries- Normal color. Moisture- Normal Moisture. No carotid bruits. No JVD.  Chest and Lung Exam Auscultation: Breath Sounds:-Normal.  Cardiovascular Auscultation:Rythm- Regular. Murmurs & Other Heart Sounds:Auscultation of the heart reveals- No Murmurs.  Abdomen Inspection:-Inspeection Normal. Palpation/Percussion:Note:No mass. Palpation and Percussion of the abdomen reveal- Non Tender, Non Distended + BS, no rebound or guarding.   Neurologic Cranial Nerve exam:- CN III-XII intact(No nystagmus), symmetric smile. Strength:- 5/5 equal and symmetric strength both upper and lower extremities.        Assessment & Plan:  For diabetes continue metformin and eat low sugar diet. Some weight loss will be beneficial.  For high cholesterol low cholesterol diet and continue atorvastatin.  For CAD history follow thru with work up recommend by Dr. Bettina Gavia.  Get colonoscopy as scheduled.  For perineum rash worse with sweating rx'd nystatin.  Follow up 3 months or as needed  [employer],  [employer]

## 2020-06-24 NOTE — Telephone Encounter (Signed)
Left message on voicemail per DPR in reference to upcoming appointment scheduled on 06/29/20 with detailed instructions given per Myocardial Perfusion Study Information Sheet for the test. LM to arrive 15 minutes early, and that it is imperative to arrive on time for appointment to keep from having the test rescheduled. If you need to cancel or reschedule your appointment, please call the office within 24 hours of your appointment. Failure to do so may result in a cancellation of your appointment, and a $50 no show fee. Phone number given for call back for any questions. Kirstie Peri

## 2020-06-26 ENCOUNTER — Other Ambulatory Visit (HOSPITAL_COMMUNITY)
Admission: RE | Admit: 2020-06-26 | Discharge: 2020-06-26 | Disposition: A | Discharge status: Home / Self Care | Payer: BC Managed Care – PPO | Source: Ambulatory Visit | Attending: Cardiology | Admitting: Cardiology

## 2020-06-26 DIAGNOSIS — Z01812 Encounter for preprocedural laboratory examination: Secondary | ICD-10-CM | POA: Diagnosis not present

## 2020-06-26 DIAGNOSIS — Z20822 Contact with and (suspected) exposure to covid-19: Secondary | ICD-10-CM | POA: Diagnosis not present

## 2020-06-26 LAB — SARS CORONAVIRUS 2 (TAT 6-24 HRS): SARS Coronavirus 2: NEGATIVE

## 2020-06-29 ENCOUNTER — Ambulatory Visit (HOSPITAL_COMMUNITY): Payer: BC Managed Care – PPO | Attending: Cardiology

## 2020-06-29 ENCOUNTER — Other Ambulatory Visit: Payer: Self-pay

## 2020-06-29 ENCOUNTER — Telehealth: Payer: Self-pay

## 2020-06-29 DIAGNOSIS — R079 Chest pain, unspecified: Secondary | ICD-10-CM | POA: Diagnosis not present

## 2020-06-29 LAB — MYOCARDIAL PERFUSION IMAGING
Estimated workload: 7 METS
Exercise duration (min): 7 min
Exercise duration (sec): 1 s
LV dias vol: 70 mL (ref 62–150)
LV sys vol: 31 mL
MPHR: 169 {beats}/min
Peak HR: 151 {beats}/min
Percent HR: 89 %
Rest HR: 85 {beats}/min
SDS: 0
SRS: 2
SSS: 2
TID: 0.95

## 2020-06-29 MED ORDER — TECHNETIUM TC 99M TETROFOSMIN IV KIT
11.0000 | PACK | Freq: Once | INTRAVENOUS | Status: AC | PRN
  Administered 2020-06-29: 11 via INTRAVENOUS
  Filled 2020-06-29: qty 11

## 2020-06-29 MED ORDER — TECHNETIUM TC 99M TETROFOSMIN IV KIT
32.0000 | PACK | Freq: Once | INTRAVENOUS | Status: AC | PRN
  Administered 2020-06-29: 32 via INTRAVENOUS
  Filled 2020-06-29: qty 32

## 2020-06-29 NOTE — Telephone Encounter (Signed)
-----   Message from Richardo Priest, MD sent at 06/29/2020  4:32 PM EST ----- Overall good result  Small area of injury from old MI  Otherwise normal  We can discuss in the office at follow-up.

## 2020-06-29 NOTE — Telephone Encounter (Signed)
Spoke with patient regarding results and recommendation.  Patient verbalizes understanding and is agreeable to plan of care. Advised patient to call back with any issues or concerns.  

## 2020-07-01 ENCOUNTER — Other Ambulatory Visit: Payer: Self-pay

## 2020-07-01 ENCOUNTER — Ambulatory Visit (AMBULATORY_SURGERY_CENTER): Payer: BC Managed Care – PPO | Admitting: *Deleted

## 2020-07-01 VITALS — Ht 70.0 in | Wt 190.0 lb

## 2020-07-01 DIAGNOSIS — Z1211 Encounter for screening for malignant neoplasm of colon: Secondary | ICD-10-CM

## 2020-07-01 MED ORDER — SUTAB 1479-225-188 MG PO TABS: 1.0000 | ORAL_TABLET | ORAL | 0 refills | Status: DC

## 2020-07-01 NOTE — Progress Notes (Signed)
Patient's pre-visit was done today over the phone with the patient due to COVID-19 pandemic. Name,DOB and address verified. Insurance verified. Patient denies any allergies to Eggs and Soy. Patient denies any problems with anesthesia/sedation. Patient denies taking diet pills or blood thinners. Packet of Prep instructions mailed to patient including a copy of a consent form and pre-procedure patient acknowledgement form (with envelope to mail back to us)-pt is aware. Sutab Coupon included. Pt aware of the cost of sutab,he did not want the large volume prep. Patient understands to call us back with any questions or concerns. COVID-19 vaccines completed x2 12/2019, per patient. Patient is aware of our care-partner policy and NIOEV-03 safety protocol. EMMI education assigned to the patient for the procedure, sent to [place].

## 2020-07-15 ENCOUNTER — Encounter: Payer: Self-pay | Admitting: Gastroenterology

## 2020-07-18 ENCOUNTER — Encounter: Payer: Self-pay | Admitting: Medical

## 2020-07-21 ENCOUNTER — Encounter: Payer: Self-pay | Admitting: Certified Registered Nurse Anesthetist

## 2020-07-22 ENCOUNTER — Ambulatory Visit (AMBULATORY_SURGERY_CENTER): Payer: BC Managed Care – PPO | Admitting: Gastroenterology

## 2020-07-22 ENCOUNTER — Encounter: Payer: Self-pay | Admitting: Gastroenterology

## 2020-07-22 ENCOUNTER — Other Ambulatory Visit: Payer: Self-pay

## 2020-07-22 VITALS — BP 116/89 | HR 83 | Temp 96.2°F | Resp 14 | Ht 70.0 in | Wt 190.0 lb

## 2020-07-22 DIAGNOSIS — Z1211 Encounter for screening for malignant neoplasm of colon: Secondary | ICD-10-CM

## 2020-07-22 DIAGNOSIS — D122 Benign neoplasm of ascending colon: Secondary | ICD-10-CM

## 2020-07-22 DIAGNOSIS — D120 Benign neoplasm of cecum: Secondary | ICD-10-CM

## 2020-07-22 DIAGNOSIS — D128 Benign neoplasm of rectum: Secondary | ICD-10-CM | POA: Diagnosis not present

## 2020-07-22 DIAGNOSIS — K641 Second degree hemorrhoids: Secondary | ICD-10-CM

## 2020-07-22 DIAGNOSIS — K5730 Diverticulosis of large intestine without perforation or abscess without bleeding: Secondary | ICD-10-CM

## 2020-07-22 DIAGNOSIS — D125 Benign neoplasm of sigmoid colon: Secondary | ICD-10-CM

## 2020-07-22 MED ORDER — SODIUM CHLORIDE 0.9 % IV SOLN: 500.0000 mL | Freq: Once | INTRAVENOUS | Status: DC

## 2020-07-22 NOTE — Patient Instructions (Signed)
YOU HAD AN ENDOSCOPIC PROCEDURE TODAY AT [facility]:   Refer to the procedure report that was given to you for any specific questions about what was found during the examination.  If the procedure report does not answer your questions, please call your gastroenterologist to clarify.  If you requested that your care partner not be given the details of your procedure findings, then the procedure report has been included in a sealed envelope for you to review at your convenience later.  **Handouts given on polyps, diverticulosis, hemorrhoids and hemorrhoid banding**  YOU SHOULD EXPECT: Some feelings of bloating in the abdomen. Passage of more gas than usual.  Walking can help get rid of the air that was put into your GI tract during the procedure and reduce the bloating. If you had a lower endoscopy (such as a colonoscopy or flexible sigmoidoscopy) you may notice spotting of blood in your stool or on the toilet paper. If you underwent a bowel prep for your procedure, you may not have a normal bowel movement for a few days.  Please Note:  You might notice some irritation and congestion in your nose or some drainage.  This is from the oxygen used during your procedure.  There is no need for concern and it should clear up in a day or so.  SYMPTOMS TO REPORT IMMEDIATELY:   Following lower endoscopy (colonoscopy or flexible sigmoidoscopy):  Excessive amounts of blood in the stool  Significant tenderness or worsening of abdominal pains  Swelling of the abdomen that is new, acute  Fever of 100F or higher  For urgent or emergent issues, a gastroenterologist can be reached at any hour by calling [phone]. Do not use MyChart messaging for urgent concerns.    DIET:  We do recommend a small meal at first, but then you may proceed to your regular diet.  Drink plenty of fluids but you should avoid alcoholic beverages for 24 hours.  ACTIVITY:  You should plan to take it easy for the  rest of today and you should NOT DRIVE or use heavy machinery until tomorrow (because of the sedation medicines used during the test).    FOLLOW UP: Our staff will call the number listed on your records 48-72 hours following your procedure to check on you and address any questions or concerns that you may have regarding the information given to you following your procedure. If we do not reach you, we will leave a message.  We will attempt to reach you two times.  During this call, we will ask if you have developed any symptoms of COVID 19. If you develop any symptoms (ie: fever, flu-like symptoms, shortness of breath, cough etc.) before then, please call [phone].  If you test positive for Covid 19 in the 2 weeks post procedure, please call and report this information to [place].    If any biopsies were taken you will be contacted by phone or by letter within the next 1-3 weeks.  Please call us at [phone] if you have not heard about the biopsies in 3 weeks.    SIGNATURES/CONFIDENTIALITY: You and/or your care partner have signed paperwork which will be entered into your electronic medical record.  These signatures attest to the fact that that the information above on your After Visit Summary has been reviewed and is understood.  Full responsibility of the confidentiality of this discharge information lies with you and/or your care-partner.

## 2020-07-22 NOTE — Progress Notes (Signed)
Called to room to assist during endoscopic procedure.  Patient ID and intended procedure confirmed with present staff. Received instructions for my participation in the procedure from the performing physician.  

## 2020-07-22 NOTE — Op Note (Signed)
[facility] Patient Name: Nakul Avino Procedure Date: 07/22/2020 3:12 PM MRN: [id number] Endoscopist: Gerrit Heck , MD Age: 50 Referring MD:  Date of Birth: [birth year] Gender: Male Account #: [id number] Procedure:                Colonoscopy Indications:              Screening for colorectal malignant neoplasm, This                            is the patient's first colonoscopy Medicines:                Monitored Anesthesia Care Procedure:                Pre-Anesthesia Assessment:                           - Prior to the procedure, a History and Physical                            was performed, and patient medications and                            allergies were reviewed. The patient's tolerance of                            previous anesthesia was also reviewed. The risks                            and benefits of the procedure and the sedation                            options and risks were discussed with the patient.                            All questions were answered, and informed consent                            was obtained. Prior Anticoagulants: The patient has                            taken no previous anticoagulant or antiplatelet                            agents. ASA Grade Assessment: III - A patient with                            severe systemic disease. After reviewing the risks                            and benefits, the patient was deemed in                            satisfactory condition to undergo the procedure.  After obtaining informed consent, the colonoscope                            was passed under direct vision. Throughout the                            procedure, the patient's blood pressure, pulse, and                            oxygen saturations were monitored continuously. The                            Colonoscope was introduced through the anus and                            advanced to the the  cecum, identified by                            appendiceal orifice and ileocecal valve. The                            colonoscopy was performed without difficulty. The                            patient tolerated the procedure well. The quality                            of the bowel preparation was good. The ileocecal                            valve, appendiceal orifice, and rectum were                            photographed. Scope In: 3:18:08 PM Scope Out: 3:43:33 PM Scope Withdrawal Time: 0 hours 22 minutes 47 seconds  Total Procedure Duration: 0 hours 25 minutes 25 seconds  Findings:                 Hemorrhoids were found on perianal exam.                           Seven sessile polyps were found in the sigmoid                            colon (3), ascending colon (3) and cecum. The polyp                            in the cecum had an adherent mucus cap. The polyps                            were 3 to 6 mm in size. These polyps were removed                            with a cold snare. Resection  and retrieval were                            complete. Estimated blood loss was minimal.                           A 8 mm polyp was found in the rectum. The polyp was                            semi-pedunculated. The polyp was removed with a                            cold snare. Resection and retrieval were complete.                            Estimated blood loss was minimal.                           A few diverticula were found in the ascending colon                            and cecum.                           Non-bleeding internal hemorrhoids were found during                            retroflexion. The hemorrhoids were small and Grade                            II (internal hemorrhoids that prolapse but reduce                            spontaneously). Complications:            No immediate complications. Estimated Blood Loss:     Estimated blood loss was minimal. Impression:                - Hemorrhoids found on perianal exam.                           - Seven 3 to 6 mm polyps in the sigmoid colon, in                            the ascending colon and in the cecum, removed with                            a cold snare. Resected and retrieved.                           - One 8 mm polyp in the rectum, removed with a cold                            snare. Resected and retrieved.                           -  Diverticulosis in the ascending colon and in the                            cecum.                           - Non-bleeding internal hemorrhoids. Recommendation:           - Patient has a contact number available for                            emergencies. The signs and symptoms of potential                            delayed complications were discussed with the                            patient. Return to normal activities tomorrow.                            Written discharge instructions were provided to the                            patient.                           - Resume previous diet.                           - Continue present medications.                           - Await pathology results.                           - Repeat colonoscopy for surveillance based on                            pathology results.                           - Return to GI clinic PRN.                           - Internal hemorrhoids were noted on this study and                            may be amenable to hemorrhoid band ligation. If you                            are interested in further treatment of these                            hemorrhoids with band ligation, please contact my                            clinic to set  up an appointment for evaluation and                            treatment. Gerrit Heck, MD 07/22/2020 3:48:26 PM

## 2020-07-22 NOTE — Progress Notes (Unsigned)
Report given to PACU, vss 

## 2020-07-22 NOTE — Progress Notes (Signed)
Pt's states no medical or surgical changes since previsit or office visit.  CW - vitals 

## 2020-07-22 NOTE — Progress Notes (Signed)
Patient last vaped around 12:30 today.

## 2020-07-24 ENCOUNTER — Telehealth: Payer: Self-pay

## 2020-07-24 NOTE — Telephone Encounter (Signed)
  Follow up Call-  Call back number 07/22/2020  Post procedure Call Back phone  # [phone]  Permission to leave phone message Yes  Some recent data might be hidden     Patient questions:  Do you have a fever, pain , or abdominal swelling? No. Pain Score  0 *  Have you tolerated food without any problems? Yes.    Have you been able to return to your normal activities? Yes.    Do you have any questions about your discharge instructions: Diet   No. Medications  No. Follow up visit  No.  Do you have questions or concerns about your Care? No.  Actions: * If pain score is 4 or above: No action needed, pain <4. 1. Have you developed a fever since your procedure? no  2.   Have you had an respiratory symptoms (SOB or cough) since your procedure? no  3.   Have you tested positive for COVID 19 since your procedure no  4.   Have you had any family members/close contacts diagnosed with the COVID 19 since your procedure?  no   If yes to any of these questions please route to Joylene John, RN and Joella Prince, RN

## 2020-07-31 ENCOUNTER — Encounter: Payer: Self-pay | Admitting: Gastroenterology

## 2020-09-29 DIAGNOSIS — H2513 Age-related nuclear cataract, bilateral: Secondary | ICD-10-CM | POA: Diagnosis not present

## 2020-09-29 DIAGNOSIS — H40033 Anatomical narrow angle, bilateral: Secondary | ICD-10-CM | POA: Diagnosis not present

## 2020-10-11 DIAGNOSIS — S52502A Unspecified fracture of the lower end of left radius, initial encounter for closed fracture: Secondary | ICD-10-CM | POA: Diagnosis not present

## 2020-10-14 ENCOUNTER — Other Ambulatory Visit: Payer: Self-pay | Admitting: Medical

## 2020-10-26 DIAGNOSIS — Z20828 Contact with and (suspected) exposure to other viral communicable diseases: Secondary | ICD-10-CM | POA: Diagnosis not present

## 2020-11-05 ENCOUNTER — Ambulatory Visit: Payer: BC Managed Care – PPO | Admitting: Medical

## 2020-11-05 ENCOUNTER — Other Ambulatory Visit: Payer: Self-pay

## 2020-11-05 ENCOUNTER — Ambulatory Visit (HOSPITAL_BASED_OUTPATIENT_CLINIC_OR_DEPARTMENT_OTHER)
Admission: RE | Admit: 2020-11-05 | Discharge: 2020-11-05 | Disposition: A | Discharge status: Home / Self Care | Payer: BC Managed Care – PPO | Source: Ambulatory Visit | Attending: Medical | Admitting: Medical

## 2020-11-05 VITALS — BP 115/72 | HR 97 | Resp 20 | Ht 69.0 in | Wt 188.0 lb

## 2020-11-05 DIAGNOSIS — R079 Chest pain, unspecified: Secondary | ICD-10-CM | POA: Diagnosis not present

## 2020-11-05 DIAGNOSIS — S299XXA Unspecified injury of thorax, initial encounter: Secondary | ICD-10-CM | POA: Diagnosis not present

## 2020-11-05 DIAGNOSIS — M940 Chondrocostal junction syndrome [Tietze]: Secondary | ICD-10-CM

## 2020-11-05 DIAGNOSIS — E119 Type 2 diabetes mellitus without complications: Secondary | ICD-10-CM | POA: Diagnosis not present

## 2020-11-05 DIAGNOSIS — R0781 Pleurodynia: Secondary | ICD-10-CM | POA: Diagnosis not present

## 2020-11-05 LAB — COMPREHENSIVE METABOLIC PANEL
ALT: 35 U/L (ref 0–53)
AST: 17 U/L (ref 0–37)
Albumin: 4.4 g/dL (ref 3.5–5.2)
Alkaline Phosphatase: 78 U/L (ref 39–117)
BUN: 13 mg/dL (ref 6–23)
CO2: 26 mEq/L (ref 19–32)
Calcium: 9.4 mg/dL (ref 8.4–10.5)
Chloride: 104 mEq/L (ref 96–112)
Creatinine, Ser: 0.83 mg/dL (ref 0.40–1.50)
GFR: 101.11 mL/min (ref >60.00)
Glucose, Bld: 163 mg/dL — ABNORMAL HIGH (ref 70–99)
Potassium: 4.2 mEq/L (ref 3.5–5.1)
Sodium: 138 mEq/L (ref 135–145)
Total Bilirubin: 0.4 mg/dL (ref 0.2–1.2)
Total Protein: 7.2 g/dL (ref 6.0–8.3)

## 2020-11-05 LAB — TROPONIN I (HIGH SENSITIVITY): High Sens Troponin I: 3 ng/L (ref 2–17)

## 2020-11-05 LAB — HEMOGLOBIN A1C: Hgb A1c MFr Bld: 7.2 % — ABNORMAL HIGH (ref 4.6–6.5)

## 2020-11-05 MED ORDER — KETOROLAC TROMETHAMINE 60 MG/2ML IM SOLN
60.0000 mg | Freq: Once | INTRAMUSCULAR | Status: AC
  Administered 2020-11-05: 60 mg via INTRAMUSCULAR

## 2020-11-05 MED ORDER — METFORMIN HCL 500 MG PO TABS: ORAL_TABLET | ORAL | 3 refills | Status: AC

## 2020-11-05 NOTE — Progress Notes (Signed)
   Subjective:    Patient ID: Tristan Cunningham, male    DOB: Aug 22, 1970, 50 y.o.   MRN: [id number]  HPI  Pt in for evaluation of some rt side chest pain and some middle chest pain.  Pain present for one week. Came on after he was moving treadmill. Pin that hold up pin came out. Treadmill track came back and leg of track hit him on rt side chest. That area is still chest. Pt now reporting some chest/costochandral area pain when he cough, gets out of bed or presses on chest wall.   No sudden dyspnea, no jaw pain, no sweating, no nauseau or vomiting.   Pt had cardiac stent placed in 2010.    The patient exercised according to the [facility] for 07:01 min:s, achieving a work level of Max. METS: 7.0. The resting heart rate of 84 bpm rose to a maximal heart rate of 151 bpm. This value represents 89 % of the maximal, age-predicted heart rate. The resting blood pressure of 129/79 mmHg , rose to a maximum blood pressure of 170/78 mmHg. The exercise test was stopped due to --.(Pt tells me he was told to stop)  Other than this pain recent pain since moving treadmill. No episodes of pain.   Pt not on plavix or other type. Stopped 5 years ago.   Review of Systems  Constitutional:  Negative for chills, fatigue and fever.  HENT:  Negative for dental problem.   Respiratory:  Negative for cough, chest tightness, shortness of breath and wheezing.   Cardiovascular:  Negative for chest pain and palpitations.       Atypical/cosotchondritis/rib pain like.  Gastrointestinal:  Negative for abdominal pain.  Musculoskeletal:        See hpi.  Neurological:  Negative for dizziness, light-headedness, numbness and headaches.  Hematological:  Negative for adenopathy. Does not bruise/bleed easily.  Psychiatric/Behavioral:  Negative for behavioral problems and confusion.       Objective:   Physical Exam  General- No acute distress. Pleasant patient. Neck- Full range of motion, no jvd Lungs- Clear, even and  unlabored. Heart- regular rate and rhythm. Neurologic- CNII- XII grossly intact.   Anterior chest- rt side rib area pain beneath nipple very tender. Lower costonchondral junction pain on palpation. Pain when he deep breaths and when randomly coughs.      Assessment & Plan:   Your pain presently seems more costochondritis like. We gave toradol 60 mg im injection to see if pain level would resolve. Then tomorrow can take low dose ibuprofen 200-400 mg every 8 hours.  With your heart history/stent we will do one set stat troponin. If elevated will advise ED evaluation.  You will see cardiogist in about 6 weeks per your report. If you pain does not resolve completely then will put in referral to move up your appointment.  If you have any pain that is worse/cardiac like at any point then ED evaluation as well.  For diabetes will get cmp and a1c today.  Follow up in 10-14 days or as needed  [employer], [employer]

## 2020-11-05 NOTE — Patient Instructions (Addendum)
Your pain presently seems more costochondritis like. We gave toradol 60 mg im injection to see if pain level would resolve. Then tomorrow can take low dose ibuprofen 200-400 mg every 8 hours.  Ekg in office shows sinus rhythm. Low voltage appearance.Similar to 2-22 ekg except better rate.  With your heart history/stent we will do one set stat troponin. If elevated will advise ED evaluation.  Also will get rt rib series with chest xray.  You will see cardiogist in about 6 weeks per your report. If you pain does not resolve completely then will put in referral to move up your appointment.  If you have any pain that is worse/cardiac like at any point then ED evaluation as well.  For diabetes will get cmp and a1c today.   Follow up in 10-14 days or as needed

## 2020-11-05 NOTE — Addendum Note (Signed)
Addended by: Jeronimo Greaves on: 11/05/2020 09:53 AM   Modules accepted: Orders

## 2020-11-05 NOTE — Addendum Note (Signed)
Addended by: Anabel Halon on: 11/05/2020 09:23 PM   Modules accepted: Orders

## 2020-11-18 ENCOUNTER — Ambulatory Visit: Payer: BC Managed Care – PPO | Admitting: Medical

## 2020-11-18 ENCOUNTER — Telehealth: Payer: Self-pay

## 2020-11-18 NOTE — Telephone Encounter (Signed)
Appointment cancelled

## 2020-11-18 NOTE — Telephone Encounter (Signed)
Caller needs to verify that his appt is cancelled. He pushed the button to cancel when he got the phone call about the appt.  Telephone: [phone]

## 2022-03-18 IMAGING — DX DG RIBS W/ CHEST 3+V*R*
3 series · 3 of 3 positions shown · non-contrast
Comparison: None.

CLINICAL DATA: Blunt trauma.  Rib pain

EXAM:
RIGHT RIBS AND CHEST - 3+ VIEW

[chest pa]
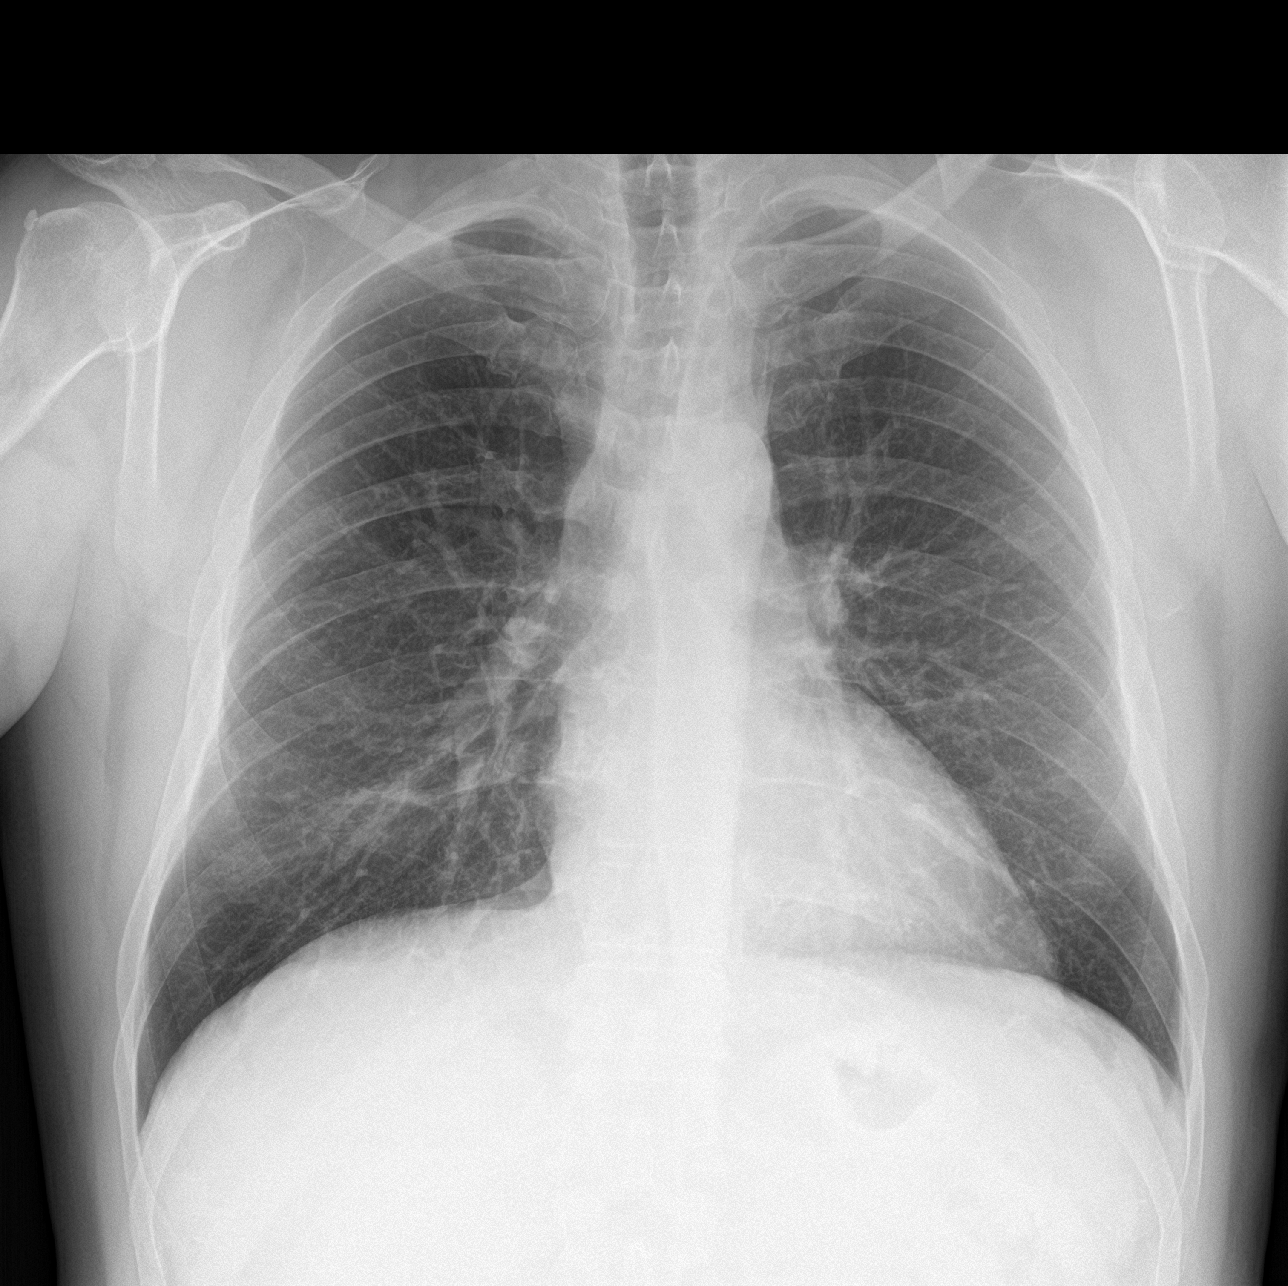

[rib pa]
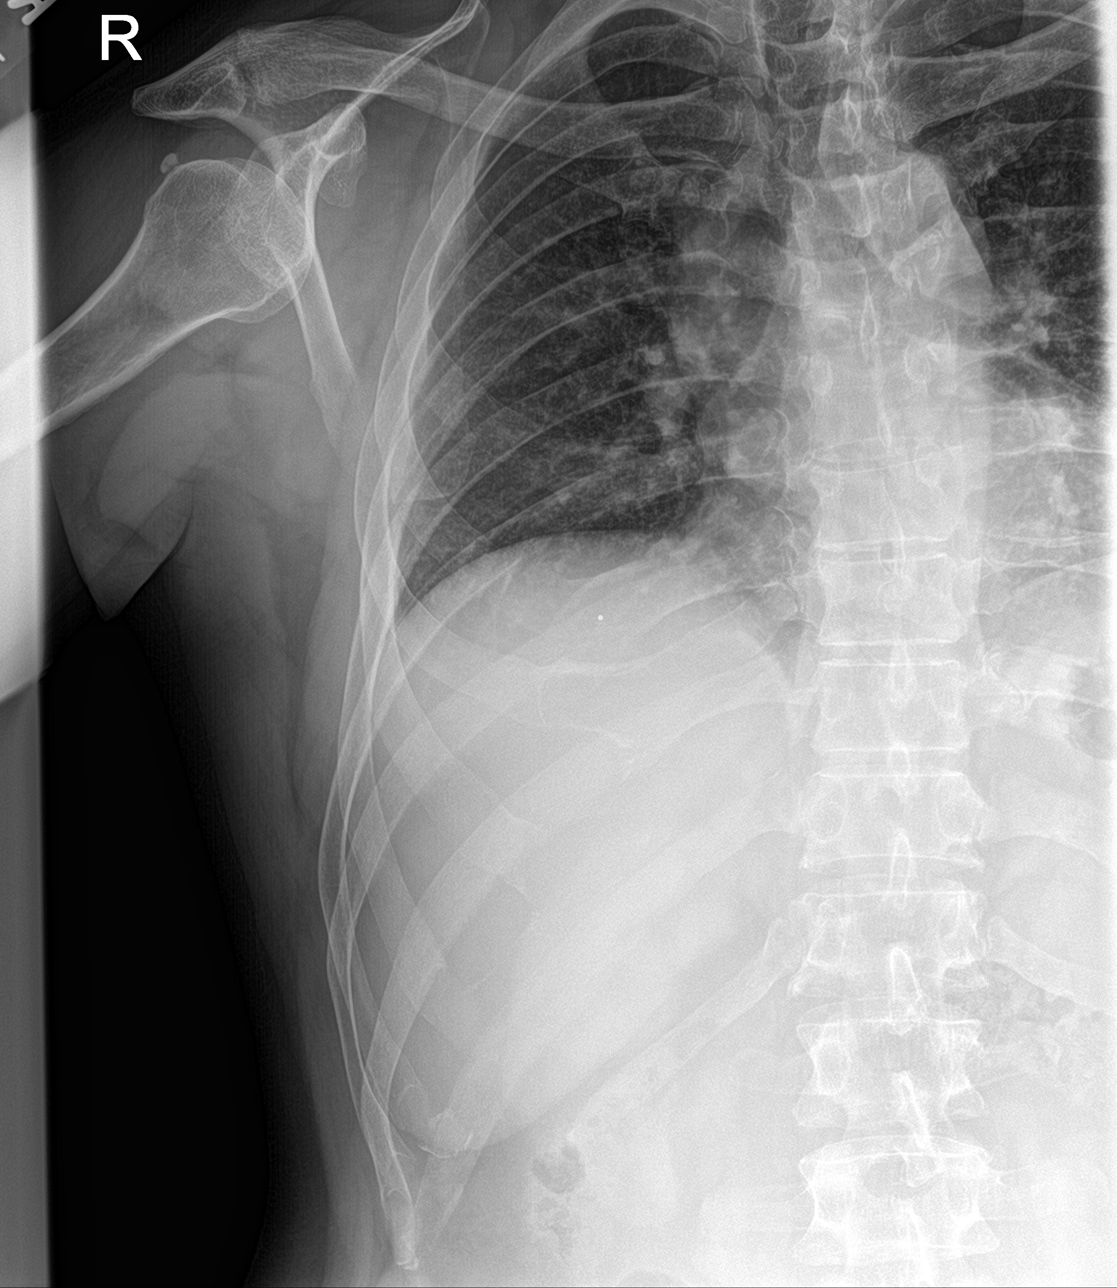

[rib pa obl]
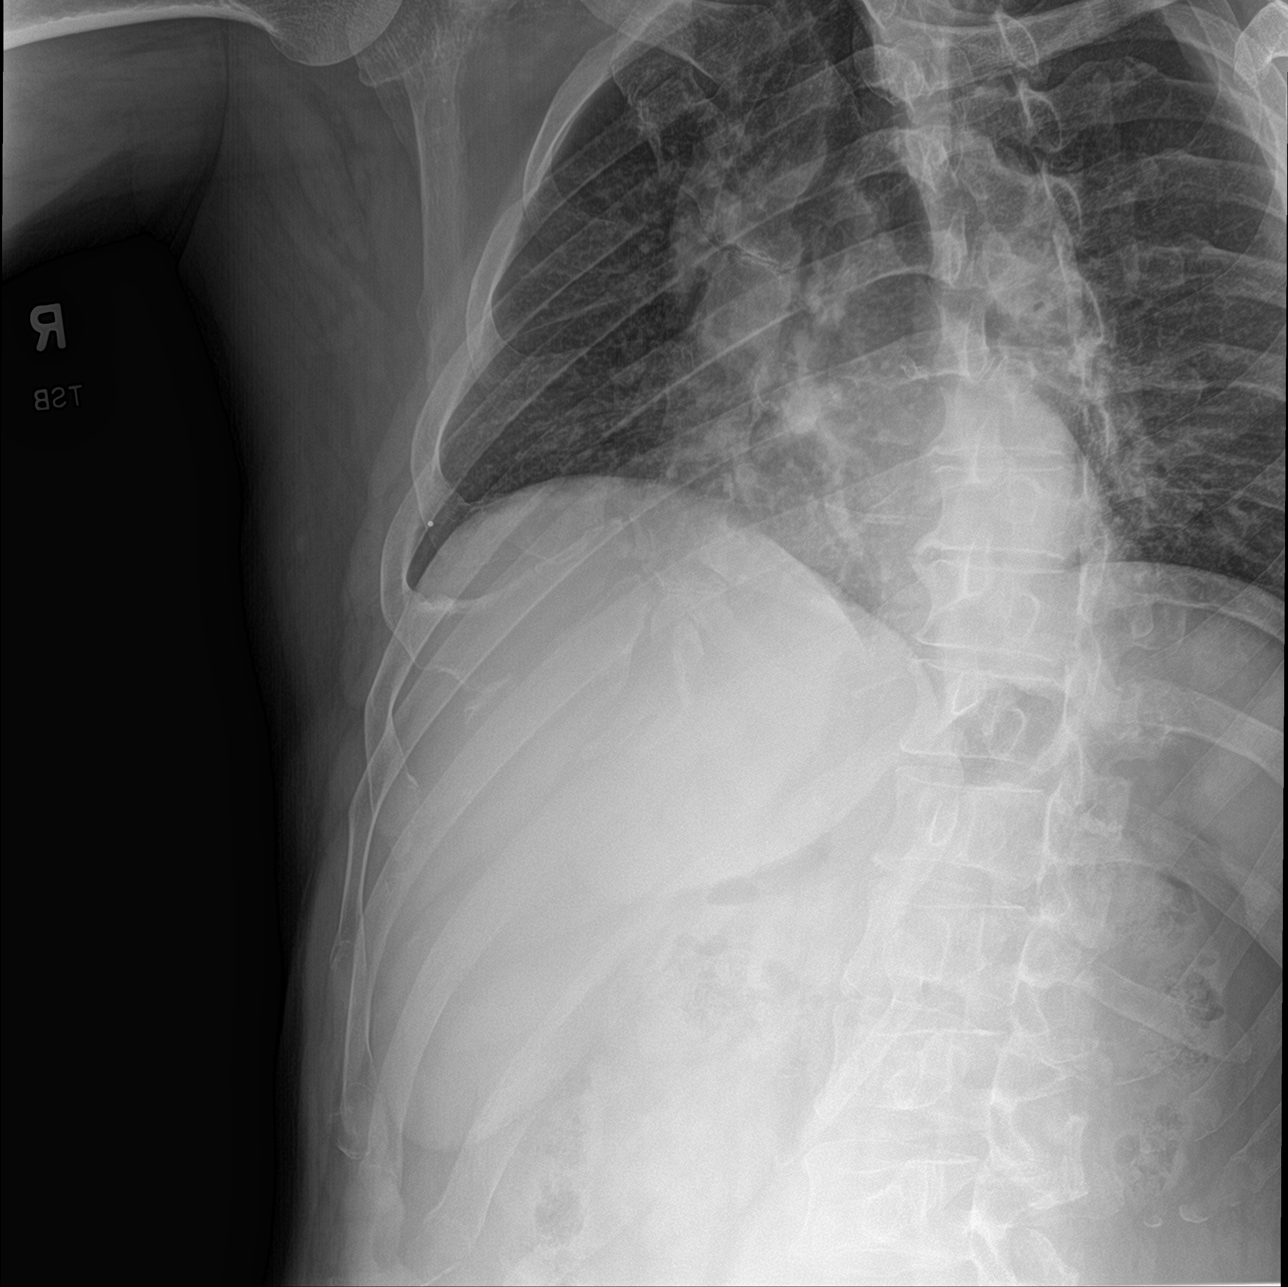

[3 of 3 positions shown; findings below may reference images not displayed]

FINDINGS: No fracture or other bone lesions are seen involving the ribs. There
is no evidence of pneumothorax or pleural effusion. Both lungs are
clear. Heart size and mediastinal contours are within normal limits.
IMPRESSION: No rib fracture. No pneumothorax. No radiographic evidence thoracic
